# Patient Record
Sex: Female | Born: 1935 | Race: White | Hispanic: No | Smoking: Never smoker
Health system: Southern US, Community
[De-identification: ages and names within clinical notes are randomized; demographics above are authoritative.]

## PROBLEM LIST (undated history)

## (undated) DIAGNOSIS — F419 Anxiety disorder, unspecified: Secondary | ICD-10-CM

## (undated) DIAGNOSIS — F329 Major depressive disorder, single episode, unspecified: Secondary | ICD-10-CM

## (undated) DIAGNOSIS — Z8489 Family history of other specified conditions: Secondary | ICD-10-CM

## (undated) DIAGNOSIS — F32A Depression, unspecified: Secondary | ICD-10-CM

## (undated) DIAGNOSIS — E0590 Thyrotoxicosis, unspecified without thyrotoxic crisis or storm: Secondary | ICD-10-CM

## (undated) DIAGNOSIS — I10 Essential (primary) hypertension: Secondary | ICD-10-CM

## (undated) HISTORY — PX: APPENDECTOMY: SHX54

## (undated) HISTORY — PX: TONSILLECTOMY: SUR1361

## (undated) HISTORY — PX: HAMMER TOE SURGERY: SHX385

---

## 1981-12-19 HISTORY — PX: ABDOMINAL HYSTERECTOMY: SHX81

## 2008-12-19 HISTORY — PX: EYE SURGERY: SHX253

## 2014-12-17 ENCOUNTER — Other Ambulatory Visit (INDEPENDENT_AMBULATORY_CARE_PROVIDER_SITE_OTHER): Payer: Self-pay

## 2014-12-17 DIAGNOSIS — E213 Hyperparathyroidism, unspecified: Secondary | ICD-10-CM

## 2014-12-25 ENCOUNTER — Encounter (HOSPITAL_COMMUNITY): Payer: Self-pay

## 2014-12-30 ENCOUNTER — Encounter (HOSPITAL_COMMUNITY)
Admission: RE | Admit: 2014-12-30 | Discharge: 2014-12-30 | Disposition: A | Discharge status: Home / Self Care | Payer: Medicare Other | Source: Ambulatory Visit | Attending: Surgery | Admitting: Surgery

## 2014-12-30 DIAGNOSIS — E213 Hyperparathyroidism, unspecified: Secondary | ICD-10-CM

## 2014-12-30 MED ORDER — TECHNETIUM TC 99M SESTAMIBI GENERIC - CARDIOLITE: 25.0000 | Freq: Once | INTRAVENOUS | Status: AC | PRN

## 2015-01-05 ENCOUNTER — Other Ambulatory Visit (INDEPENDENT_AMBULATORY_CARE_PROVIDER_SITE_OTHER): Payer: Self-pay

## 2015-01-05 ENCOUNTER — Other Ambulatory Visit (INDEPENDENT_AMBULATORY_CARE_PROVIDER_SITE_OTHER): Payer: Self-pay | Admitting: *Deleted

## 2015-01-05 ENCOUNTER — Other Ambulatory Visit: Payer: Self-pay | Admitting: Surgery

## 2015-01-05 DIAGNOSIS — D351 Benign neoplasm of parathyroid gland: Secondary | ICD-10-CM

## 2015-01-05 NOTE — Addendum Note (Signed)
Addended by: Earnstine Regal on: 01/05/2015 03:02 PM   Modules accepted: Orders

## 2015-01-07 ENCOUNTER — Ambulatory Visit
Admission: RE | Admit: 2015-01-07 | Discharge: 2015-01-07 | Disposition: A | Discharge status: Home / Self Care | Payer: Medicare Other | Source: Ambulatory Visit | Attending: Surgery | Admitting: Surgery

## 2015-01-19 ENCOUNTER — Ambulatory Visit (INDEPENDENT_AMBULATORY_CARE_PROVIDER_SITE_OTHER): Payer: Self-pay | Admitting: Surgery

## 2015-02-12 NOTE — Pre-Procedure Instructions (Addendum)
Jaydah Stahle  02/12/2015   Your procedure is scheduled on:  Friday, March 4.  Report to [facility] Admitting at 7:30 AM.  Call this number if you have problems the morning of surgery: [phone]               For any other questions, please call [phone], Monday - Friday 8 AM - 4 PM.   Remember:   Do not eat food or drink liquids after midnight Thursday, march 3.   Take these medicines the morning of surgery with A SIP OF WATER: amLODipine (NORVASC), sertraline (ZOLOFT). Eye drops if needed               Stop taking Aspirin, Coumadin, Plavix, Effient and Herbal medications.  Do not take any NSAIDs ie: Ibuprofen,  Advil,Naproxen or any medication containing Aspirin. Stop vitamins starting now    Do not wear jewelry, make-up or nail polish.  Do not wear lotions, powders, or perfumes.   Do not shave 48 hours prior to surgery.   Do not bring valuables to the hospital. [facility]      is not responsible for any belongings or valuables.               Contacts, dentures or bridgework may not be worn into surgery.  Leave suitcase in the car. After surgery it may be brought to your room.  For patients admitted to the hospital, discharge time is determined by your treatment team.               Patients discharged the day of surgery will not be allowed to drive home.  Name and phone number of your driver:    Special Instructions: - Special Instructions: [facility] - Preparing for Surgery  Before surgery, you can play an important role.  Because skin is not sterile, your skin needs to be as free of germs as possible.  You can reduce the number of germs on you skin by washing with CHG (chlorahexidine gluconate) soap before surgery.  CHG is an antiseptic cleaner which kills germs and bonds with the skin to continue killing germs even after washing.  Please DO NOT use if you have an allergy to CHG or antibacterial soaps.  If your skin becomes reddened/irritated stop using the  CHG and inform your nurse when you arrive at Short Stay.  Do not shave (including legs and underarms) for at least 48 hours prior to the first CHG shower.  You may shave your face.  Please follow these instructions carefully:   1.  Shower with CHG Soap the night before surgery and the morning of Surgery.  2.  If you choose to wash your hair, wash your hair first as usual with your normal shampoo.  3.  After you shampoo, rinse your hair and body thoroughly to remove the Shampoo.  4.  Use CHG as you would any other liquid soap.  You can apply chg directly  to the skin and wash gently with scrungie or a clean washcloth.  5.  Apply the CHG Soap to your body ONLY FROM THE NECK DOWN.  Do not use on open wounds or open sores.  Avoid contact with your eyes ears, mouth and genitals (private parts).  Wash genitals (private parts)       with your normal soap.  6.  Wash thoroughly, paying special attention to the area where your surgery will be performed.  7.  Thoroughly rinse your body with  warm water from the neck down.  8.  DO NOT shower/wash with your normal soap after using and rinsing off the CHG Soap.  9.  Pat yourself dry with a clean towel.            10.  Wear clean pajamas.            11.  Place clean sheets on your bed the night of your first shower and do not sleep with pets.  Day of Surgery  Do not apply any lotions/deodorants the morning of surgery.  Please wear clean clothes to the hospital/surgery center.   Please read over the following fact sheets that you were given: Pain Booklet, Coughing and Deep Breathing and Surgical Site Infection Prevention

## 2015-02-13 ENCOUNTER — Encounter (HOSPITAL_COMMUNITY)
Admission: RE | Admit: 2015-02-13 | Discharge: 2015-02-13 | Disposition: A | Discharge status: Home / Self Care | Payer: Medicare Other | Source: Ambulatory Visit | Attending: Surgery | Admitting: Surgery

## 2015-02-13 ENCOUNTER — Encounter (HOSPITAL_COMMUNITY): Payer: Self-pay

## 2015-02-13 DIAGNOSIS — R0602 Shortness of breath: Secondary | ICD-10-CM | POA: Diagnosis not present

## 2015-02-13 DIAGNOSIS — I10 Essential (primary) hypertension: Secondary | ICD-10-CM | POA: Diagnosis not present

## 2015-02-13 DIAGNOSIS — Z01812 Encounter for preprocedural laboratory examination: Secondary | ICD-10-CM | POA: Insufficient documentation

## 2015-02-13 DIAGNOSIS — E210 Primary hyperparathyroidism: Secondary | ICD-10-CM | POA: Insufficient documentation

## 2015-02-13 DIAGNOSIS — F418 Other specified anxiety disorders: Secondary | ICD-10-CM | POA: Diagnosis not present

## 2015-02-13 HISTORY — DX: Essential (primary) hypertension: I10

## 2015-02-13 HISTORY — DX: Thyrotoxicosis, unspecified without thyrotoxic crisis or storm: E05.90

## 2015-02-13 HISTORY — DX: Anxiety disorder, unspecified: F41.9

## 2015-02-13 HISTORY — DX: Family history of other specified conditions: Z84.89

## 2015-02-13 HISTORY — DX: Major depressive disorder, single episode, unspecified: F32.9

## 2015-02-13 HISTORY — DX: Depression, unspecified: F32.A

## 2015-02-13 LAB — CBC
HCT: 40.9 % (ref 36.0–46.0)
Hemoglobin: 13.5 g/dL (ref 12.0–15.0)
MCH: 30.1 pg (ref 26.0–34.0)
MCHC: 33 g/dL (ref 30.0–36.0)
MCV: 91.1 fL (ref 78.0–100.0)
PLATELETS: 239 10*3/uL (ref 150–400)
RBC: 4.49 MIL/uL (ref 3.87–5.11)
RDW: 14 % (ref 11.5–15.5)
WBC: 5.4 10*3/uL (ref 4.0–10.5)

## 2015-02-13 LAB — BASIC METABOLIC PANEL
ANION GAP: 4 — AB (ref 5–15)
BUN: 9 mg/dL (ref 6–23)
CALCIUM: 9.5 mg/dL (ref 8.4–10.5)
CO2: 27 mmol/L (ref 19–32)
CREATININE: 0.76 mg/dL (ref 0.50–1.10)
Chloride: 106 mmol/L (ref 96–112)
GFR calc Af Amer: 89 mL/min — ABNORMAL LOW (ref >90)
GFR calc non Af Amer: 76 mL/min — ABNORMAL LOW (ref >90)
GLUCOSE: 111 mg/dL — AB (ref 70–99)
Potassium: 4.3 mmol/L (ref 3.5–5.1)
SODIUM: 137 mmol/L (ref 135–145)

## 2015-02-13 NOTE — Progress Notes (Signed)
Quick Note:  These results are acceptable for scheduled surgery.  Robbin Escher M. Lathon Adan, MD, FACS [facility] Office: [phone]   ______ 

## 2015-02-13 NOTE — Progress Notes (Signed)
Quick Note:  EKG is acceptable for scheduled surgery.  Domini Vandehei M. Khalib Fendley, MD, FACS [facility] Office: [phone]   ______ 

## 2015-02-19 ENCOUNTER — Encounter (HOSPITAL_COMMUNITY): Payer: Self-pay | Admitting: Surgery

## 2015-02-19 DIAGNOSIS — E210 Primary hyperparathyroidism: Secondary | ICD-10-CM | POA: Diagnosis present

## 2015-02-19 MED ORDER — CEFAZOLIN SODIUM-DEXTROSE 2-3 GM-% IV SOLR
2.0000 g | INTRAVENOUS | Status: AC
  Administered 2015-02-20: 2 g via INTRAVENOUS
  Filled 2015-02-19: qty 50

## 2015-02-19 NOTE — H&P (Signed)
General Surgery [facility]  Sue Hines DOB: 1930-10-28 Widowed / Language: English / Race: White Female  History of Present Illness  The patient is a 81 year old female who presents with a parathyroid neoplasm. Patient is referred by Dr. Bud Face for evaluation of suspected primary hyperparathyroidism. Patient was found on routine laboratory studies performed by her primary care physician to have mild hypercalcemia. Laboratory studies show calcium levels of 10.8. Patient had intact PTH levels ranging from 64-107. She subsequently underwent 24-hour urine collection with a range of results from 151 to greater than 1900. Patient has been treated for vitamin D deficiency. She is now referred for possible primary hyperparathyroidism. She has not had any imaging studies. Patient denies any previous neck surgery. She denies any family history of endocrinopathy and specifically no history of adrenal or pituitary tumors. Patient denies nephrolithiasis. She thinks she has osteopenia but not osteoporosis. She denies bone or joint pain. She denies fatigue. She is accompanied today by her daughter.   Other Problems High blood pressure  Past Surgical History Appendectomy Hysterectomy (not due to cancer) - Complete Tonsillectomy  Diagnostic Studies History Colonoscopy 1-5 years ago Mammogram within last year  Allergies  Sulfa Antibiotics  Medication History Betaxolol HCl (Ophth) (0.5% Solution, Ophthalmic bid) Active. Aspirin Low Strength (81MG  Tablet Chewable, Oral) Active. AmLODIPine Besylate (5MG  Tablet, Oral) Active. Montelukast Sodium (10MG  Tablet, Oral) Active. Estradiol (0.05MG /24HR Patch Weekly, Transdermal) Active.  Social History Alcohol use Moderate alcohol use. Caffeine use Coffee, Tea. No drug use Tobacco use Never smoker.  Family History Alcohol Abuse Mother. Heart Disease Brother, Father. Thyroid problems  Daughter, Son.  Pregnancy / Birth History  Age at menarche 65 years. Age of menopause 42-50 Gravida 2 Irregular periods Maternal age 20-25 Para 2  Review of Systems  General Not Present- Appetite Loss, Chills, Fatigue, Fever, Night Sweats, Weight Gain and Weight Loss. Skin Not Present- Change in Wart/Mole, Dryness, Hives, Jaundice, New Lesions, Non-Healing Wounds, Rash and Ulcer. HEENT Present- Seasonal Allergies and Wears glasses/contact lenses. Not Present- Earache, Hearing Loss, Hoarseness, Nose Bleed, Oral Ulcers, Ringing in the Ears, Sinus Pain, Sore Throat, Visual Disturbances and Yellow Eyes. Respiratory Not Present- Bloody sputum, Chronic Cough, Difficulty Breathing, Snoring and Wheezing. Breast Not Present- Breast Mass, Breast Pain, Nipple Discharge and Skin Changes. Cardiovascular Not Present- Chest Pain, Difficulty Breathing Lying Down, Leg Cramps, Palpitations, Rapid Heart Rate, Shortness of Breath and Swelling of Extremities. Gastrointestinal Not Present- Abdominal Pain, Bloating, Bloody Stool, Change in Bowel Habits, Chronic diarrhea, Constipation, Difficulty Swallowing, Excessive gas, Gets full quickly at meals, Hemorrhoids, Indigestion, Nausea, Rectal Pain and Vomiting. Female Genitourinary Present- Frequency and Nocturia. Not Present- Painful Urination, Pelvic Pain and Urgency. Musculoskeletal Not Present- Back Pain, Joint Pain, Joint Stiffness, Muscle Pain, Muscle Weakness and Swelling of Extremities. Neurological Not Present- Decreased Memory, Fainting, Headaches, Numbness, Seizures, Tingling, Tremor, Trouble walking and Weakness. Psychiatric Not Present- Anxiety, Bipolar, Change in Sleep Pattern, Depression, Fearful and Frequent crying. Endocrine Not Present- Cold Intolerance, Excessive Hunger, Hair Changes, Heat Intolerance, Hot flashes and New Diabetes. Hematology Not Present- Easy Bruising, Excessive bleeding, Gland problems, HIV and Persistent  Infections.   Vitals  12/17/2014 2:09 PM Weight: 190 lb Height: 66in Body Surface Area: 2 m Body Mass Index: 30.67 kg/m Temp.: 98.41F(Temporal)  Pulse: 72 (Regular)  Resp.: 18 (Unlabored)  BP: 116/70 (Sitting, Left Arm, Standard)    Physical Exam  General - appears comfortable, no distress; not diaphorectic  HEENT - normocephalic; sclerae clear,  gaze conjugate; mucous membranes moist, dentition good; voice normal  Neck - symmetric on extension; no palpable anterior or posterior cervical adenopathy; no palpable masses in the thyroid bed  Chest - clear bilaterally with rhonchi, rales, or wheeze  Cor - regular rhythm with normal rate; no significant murmur  Ext - non-tender without significant edema or lymphedema  Neuro - grossly intact; no tremor    Assessment & Plan  PRIMARY HYPERPARATHYROIDISM (252.01  E21.0)  The patient most likely has primary hyperparathyroidism. I provided her with written literature on parathyroid disease to review at home.  Nuclear med scan was negative for adenoma.  Ultrasound shows a 6 mm nodule in the right inferior position suspicious for adenoma.  Plan exploration and parathyroidectomy.  The risks and benefits of the procedure have been discussed at length with the patient.  The patient understands the proposed procedure, potential alternative treatments, and the course of recovery to be expected.  All of the patient's questions have been answered at this time.  The patient wishes to proceed with surgery.  Earnstine Regal, MD, [facility] Office: [phone]

## 2015-02-20 ENCOUNTER — Ambulatory Visit (HOSPITAL_COMMUNITY): Payer: Medicare Other | Admitting: Certified Registered"

## 2015-02-20 ENCOUNTER — Observation Stay (HOSPITAL_COMMUNITY)
Admission: RE | Admit: 2015-02-20 | Discharge: 2015-02-21 | Disposition: A | Discharge status: Home / Self Care | Payer: Medicare Other | Source: Ambulatory Visit | Attending: Surgery | Admitting: Surgery

## 2015-02-20 ENCOUNTER — Encounter (HOSPITAL_COMMUNITY): Payer: Self-pay | Admitting: *Deleted

## 2015-02-20 ENCOUNTER — Encounter (HOSPITAL_COMMUNITY)
Admission: RE | Disposition: A | Discharge status: Home / Self Care | Payer: Self-pay | Source: Ambulatory Visit | Attending: Surgery

## 2015-02-20 DIAGNOSIS — E210 Primary hyperparathyroidism: Secondary | ICD-10-CM | POA: Diagnosis not present

## 2015-02-20 DIAGNOSIS — F418 Other specified anxiety disorders: Secondary | ICD-10-CM | POA: Insufficient documentation

## 2015-02-20 DIAGNOSIS — R0602 Shortness of breath: Secondary | ICD-10-CM | POA: Insufficient documentation

## 2015-02-20 DIAGNOSIS — I10 Essential (primary) hypertension: Secondary | ICD-10-CM | POA: Diagnosis not present

## 2015-02-20 HISTORY — PX: PARATHYROIDECTOMY: SHX19

## 2015-02-20 SURGERY — PARATHYROIDECTOMY
Anesthesia: General

## 2015-02-20 MED ORDER — ARTIFICIAL TEARS OP OINT
TOPICAL_OINTMENT | OPHTHALMIC | Status: DC | PRN
  Administered 2015-02-20: 1 via OPHTHALMIC

## 2015-02-20 MED ORDER — LACTATED RINGERS IV SOLN
INTRAVENOUS | Status: DC
  Administered 2015-02-20: 08:00:00 via INTRAVENOUS

## 2015-02-20 MED ORDER — LACTATED RINGERS IV SOLN
INTRAVENOUS | Status: DC | PRN
  Administered 2015-02-20 (×2): via INTRAVENOUS

## 2015-02-20 MED ORDER — NEOSTIGMINE METHYLSULFATE 10 MG/10ML IV SOLN
INTRAVENOUS | Status: AC
  Filled 2015-02-20: qty 1

## 2015-02-20 MED ORDER — NEOSTIGMINE METHYLSULFATE 10 MG/10ML IV SOLN
INTRAVENOUS | Status: DC | PRN
  Administered 2015-02-20: 4 mg via INTRAVENOUS

## 2015-02-20 MED ORDER — KCL IN DEXTROSE-NACL 20-5-0.45 MEQ/L-%-% IV SOLN
INTRAVENOUS | Status: DC
  Administered 2015-02-20: 17:00:00 via INTRAVENOUS
  Filled 2015-02-20 (×2): qty 1000

## 2015-02-20 MED ORDER — FENTANYL CITRATE 0.05 MG/ML IJ SOLN
INTRAMUSCULAR | Status: AC
  Filled 2015-02-20: qty 5

## 2015-02-20 MED ORDER — FENTANYL CITRATE 0.05 MG/ML IJ SOLN
INTRAMUSCULAR | Status: DC | PRN
  Administered 2015-02-20: 50 ug via INTRAVENOUS
  Administered 2015-02-20: 150 ug via INTRAVENOUS

## 2015-02-20 MED ORDER — ONDANSETRON HCL 4 MG/2ML IJ SOLN
4.0000 mg | Freq: Four times a day (QID) | INTRAMUSCULAR | Status: DC | PRN
Start: 2015-02-20 — End: 2015-02-21

## 2015-02-20 MED ORDER — ACETAMINOPHEN 325 MG PO TABS: 650.0000 mg | ORAL_TABLET | ORAL | Status: DC | PRN

## 2015-02-20 MED ORDER — HYDROCODONE-ACETAMINOPHEN 5-325 MG PO TABS: 1.0000 | ORAL_TABLET | ORAL | Status: DC | PRN

## 2015-02-20 MED ORDER — EPHEDRINE SULFATE 50 MG/ML IJ SOLN
INTRAMUSCULAR | Status: DC | PRN
  Administered 2015-02-20: 5 mg via INTRAVENOUS
  Administered 2015-02-20: 10 mg via INTRAVENOUS

## 2015-02-20 MED ORDER — GLYCOPYRROLATE 0.2 MG/ML IJ SOLN
INTRAMUSCULAR | Status: DC | PRN
  Administered 2015-02-20: 0.6 mg via INTRAVENOUS

## 2015-02-20 MED ORDER — PHENYLEPHRINE 40 MCG/ML (10ML) SYRINGE FOR IV PUSH (FOR BLOOD PRESSURE SUPPORT)
PREFILLED_SYRINGE | INTRAVENOUS | Status: AC
  Filled 2015-02-20: qty 20

## 2015-02-20 MED ORDER — PROPOFOL 10 MG/ML IV BOLUS
INTRAVENOUS | Status: DC | PRN
  Administered 2015-02-20: 130 mg via INTRAVENOUS

## 2015-02-20 MED ORDER — HYDROMORPHONE HCL 1 MG/ML IJ SOLN: 1.0000 mg | INTRAMUSCULAR | Status: DC | PRN

## 2015-02-20 MED ORDER — PHENYLEPHRINE HCL 10 MG/ML IJ SOLN
INTRAMUSCULAR | Status: DC | PRN
  Administered 2015-02-20: 120 ug via INTRAVENOUS
  Administered 2015-02-20 (×4): 80 ug via INTRAVENOUS

## 2015-02-20 MED ORDER — PROPOFOL 10 MG/ML IV BOLUS
INTRAVENOUS | Status: AC
  Filled 2015-02-20: qty 20

## 2015-02-20 MED ORDER — ARTIFICIAL TEARS OP OINT
TOPICAL_OINTMENT | OPHTHALMIC | Status: AC
  Filled 2015-02-20: qty 3.5

## 2015-02-20 MED ORDER — PHENOL 1.4 % MT LIQD
1.0000 | OROMUCOSAL | Status: DC | PRN
  Administered 2015-02-20: 1 via OROMUCOSAL
  Filled 2015-02-20: qty 177

## 2015-02-20 MED ORDER — 0.9 % SODIUM CHLORIDE (POUR BTL) OPTIME
TOPICAL | Status: DC | PRN
  Administered 2015-02-20: 1000 mL

## 2015-02-20 MED ORDER — ONDANSETRON HCL 4 MG/2ML IJ SOLN: 4.0000 mg | Freq: Once | INTRAMUSCULAR | Status: DC | PRN

## 2015-02-20 MED ORDER — ONDANSETRON HCL 4 MG/2ML IJ SOLN
INTRAMUSCULAR | Status: AC
  Filled 2015-02-20: qty 2

## 2015-02-20 MED ORDER — AMLODIPINE BESYLATE 5 MG PO TABS
5.0000 mg | ORAL_TABLET | Freq: Every day | ORAL | Status: DC
  Administered 2015-02-21: 5 mg via ORAL
  Filled 2015-02-20: qty 1

## 2015-02-20 MED ORDER — HYDROMORPHONE HCL 1 MG/ML IJ SOLN
0.2500 mg | INTRAMUSCULAR | Status: DC | PRN
  Administered 2015-02-20 (×4): 0.5 mg via INTRAVENOUS

## 2015-02-20 MED ORDER — HYDROMORPHONE HCL 1 MG/ML IJ SOLN
INTRAMUSCULAR | Status: AC
  Filled 2015-02-20: qty 1

## 2015-02-20 MED ORDER — BETAXOLOL HCL 0.5 % OP SOLN
1.0000 [drp] | Freq: Two times a day (BID) | OPHTHALMIC | Status: DC
  Administered 2015-02-21: 1 [drp] via OPHTHALMIC
  Filled 2015-02-20: qty 5

## 2015-02-20 MED ORDER — LATANOPROST 0.005 % OP SOLN
1.0000 [drp] | Freq: Every day | OPHTHALMIC | Status: DC
  Filled 2015-02-20: qty 2.5

## 2015-02-20 MED ORDER — DEXAMETHASONE SODIUM PHOSPHATE 4 MG/ML IJ SOLN
INTRAMUSCULAR | Status: AC
Start: 2015-02-20 — End: 2015-02-20
  Filled 2015-02-20: qty 1

## 2015-02-20 MED ORDER — ROCURONIUM BROMIDE 100 MG/10ML IV SOLN
INTRAVENOUS | Status: DC | PRN
  Administered 2015-02-20: 40 mg via INTRAVENOUS

## 2015-02-20 MED ORDER — MEPERIDINE HCL 25 MG/ML IJ SOLN: 6.2500 mg | INTRAMUSCULAR | Status: DC | PRN

## 2015-02-20 MED ORDER — HYDROCODONE-ACETAMINOPHEN 5-325 MG PO TABS: 1.0000 | ORAL_TABLET | ORAL | Status: AC | PRN

## 2015-02-20 MED ORDER — LIDOCAINE HCL (CARDIAC) 20 MG/ML IV SOLN
INTRAVENOUS | Status: DC | PRN
  Administered 2015-02-20: 100 mg via INTRAVENOUS

## 2015-02-20 MED ORDER — ONDANSETRON HCL 4 MG PO TABS: 4.0000 mg | ORAL_TABLET | Freq: Four times a day (QID) | ORAL | Status: DC | PRN

## 2015-02-20 MED ORDER — SERTRALINE HCL 50 MG PO TABS
50.0000 mg | ORAL_TABLET | Freq: Every day | ORAL | Status: DC
  Administered 2015-02-21: 50 mg via ORAL
  Filled 2015-02-20: qty 1

## 2015-02-20 MED ORDER — BUPIVACAINE HCL (PF) 0.25 % IJ SOLN
INTRAMUSCULAR | Status: AC
Start: 2015-02-20 — End: 2015-02-20
  Filled 2015-02-20: qty 30

## 2015-02-20 MED ORDER — ONDANSETRON HCL 4 MG/2ML IJ SOLN
INTRAMUSCULAR | Status: DC | PRN
  Administered 2015-02-20: 4 mg via INTRAVENOUS

## 2015-02-20 MED ORDER — GLYCOPYRROLATE 0.2 MG/ML IJ SOLN
INTRAMUSCULAR | Status: AC
  Filled 2015-02-20: qty 3

## 2015-02-20 SURGICAL SUPPLY — 47 items
ATTRACTOMAT 16X20 MAGNETIC DRP (DRAPES) ×2 IMPLANT
BENZOIN TINCTURE PRP APPL 2/3 (GAUZE/BANDAGES/DRESSINGS) ×2 IMPLANT
BLADE SURG 10 STRL SS (BLADE) ×2 IMPLANT
BLADE SURG 15 STRL LF DISP TIS (BLADE) ×1 IMPLANT
BLADE SURG 15 STRL SS (BLADE) ×1
CANISTER SUCTION 2500CC (MISCELLANEOUS) ×2 IMPLANT
CHLORAPREP W/TINT 10.5 ML (MISCELLANEOUS) ×2 IMPLANT
CLIP TI MEDIUM 6 (CLIP) ×2 IMPLANT
CLIP TI WIDE RED SMALL 6 (CLIP) ×6 IMPLANT
CONT SPEC 4OZ CLIKSEAL STRL BL (MISCELLANEOUS) ×2 IMPLANT
COVER SURGICAL LIGHT HANDLE (MISCELLANEOUS) ×2 IMPLANT
DERMABOND ADVANCED (GAUZE/BANDAGES/DRESSINGS) ×1
DERMABOND ADVANCED .7 DNX12 (GAUZE/BANDAGES/DRESSINGS) ×1 IMPLANT
DRAPE PED LAPAROTOMY (DRAPES) ×2 IMPLANT
DRAPE UTILITY XL STRL (DRAPES) ×6 IMPLANT
ELECT CAUTERY BLADE 6.4 (BLADE) ×2 IMPLANT
ELECT REM PT RETURN 9FT ADLT (ELECTROSURGICAL) ×2
ELECTRODE REM PT RTRN 9FT ADLT (ELECTROSURGICAL) ×1 IMPLANT
GAUZE SPONGE 2X2 8PLY STRL LF (GAUZE/BANDAGES/DRESSINGS) ×1 IMPLANT
GAUZE SPONGE 4X4 16PLY XRAY LF (GAUZE/BANDAGES/DRESSINGS) ×2 IMPLANT
GLOVE SURG ORTHO 8.0 STRL STRW (GLOVE) ×4 IMPLANT
GOWN STRL REUS W/ TWL LRG LVL3 (GOWN DISPOSABLE) ×4 IMPLANT
GOWN STRL REUS W/ TWL XL LVL3 (GOWN DISPOSABLE) ×1 IMPLANT
GOWN STRL REUS W/TWL LRG LVL3 (GOWN DISPOSABLE) ×4
GOWN STRL REUS W/TWL XL LVL3 (GOWN DISPOSABLE) ×1
HEMOSTAT SURGICEL 2X4 FIBR (HEMOSTASIS) ×2 IMPLANT
KIT BASIN OR (CUSTOM PROCEDURE TRAY) ×2 IMPLANT
KIT ROOM TURNOVER OR (KITS) ×2 IMPLANT
NEEDLE HYPO 25GX1X1/2 BEV (NEEDLE) IMPLANT
NS IRRIG 1000ML POUR BTL (IV SOLUTION) ×2 IMPLANT
PACK SURGICAL SETUP 50X90 (CUSTOM PROCEDURE TRAY) ×2 IMPLANT
PAD ARMBOARD 7.5X6 YLW CONV (MISCELLANEOUS) ×4 IMPLANT
PENCIL BUTTON HOLSTER BLD 10FT (ELECTRODE) ×2 IMPLANT
SPONGE GAUZE 2X2 STER 10/PKG (GAUZE/BANDAGES/DRESSINGS) ×1
SPONGE INTESTINAL PEANUT (DISPOSABLE) IMPLANT
STRIP CLOSURE SKIN 1/2X4 (GAUZE/BANDAGES/DRESSINGS) IMPLANT
SUT MNCRL AB 4-0 PS2 18 (SUTURE) ×2 IMPLANT
SUT SILK 2 0 (SUTURE) ×1
SUT SILK 2-0 18XBRD TIE 12 (SUTURE) ×1 IMPLANT
SUT SILK 3 0 (SUTURE)
SUT SILK 3-0 18XBRD TIE 12 (SUTURE) IMPLANT
SUT VIC AB 3-0 SH 18 (SUTURE) ×2 IMPLANT
SYR BULB IRRIGATION 50ML (SYRINGE) ×2 IMPLANT
SYR CONTROL 10ML LL (SYRINGE) IMPLANT
TOWEL OR 17X24 6PK STRL BLUE (TOWEL DISPOSABLE) ×2 IMPLANT
TOWEL OR 17X26 10 PK STRL BLUE (TOWEL DISPOSABLE) ×2 IMPLANT
TUBE CONNECTING 12X1/4 (SUCTIONS) ×2 IMPLANT

## 2015-02-20 NOTE — Anesthesia Preprocedure Evaluation (Addendum)
Anesthesia Evaluation  Patient identified by MRN, date of birth, ID band Patient awake    Reviewed: Allergy & Precautions, NPO status , Patient's Chart, lab work & pertinent test results  Airway Mallampati: I  TM Distance: >3 FB Neck ROM: Full    Dental  (+) Teeth Intact, Dental Advisory Given   Pulmonary          Cardiovascular hypertension, Pt. on medications     Neuro/Psych Anxiety Depression    GI/Hepatic   Endo/Other    Renal/GU      Musculoskeletal   Abdominal   Peds  Hematology   Anesthesia Other Findings   Reproductive/Obstetrics                            Anesthesia Physical Anesthesia Plan  ASA: III  Anesthesia Plan: General   Post-op Pain Management:    Induction: Intravenous  Airway Management Planned: Oral ETT  Additional Equipment:   Intra-op Plan:   Post-operative Plan: Extubation in OR  Informed Consent: I have reviewed the patients History and Physical, chart, labs and discussed the procedure including the risks, benefits and alternatives for the proposed anesthesia with the patient or authorized representative who has indicated his/her understanding and acceptance.     Plan Discussed with: CRNA and Surgeon  Anesthesia Plan Comments:         Anesthesia Quick Evaluation

## 2015-02-20 NOTE — Interval H&P Note (Signed)
History and Physical Interval Note:  02/20/2015 9:50 AM  Sue Hines  has presented today for surgery, with the diagnosis of PRIMARY HYPERPARATHYROIDISM.  The various methods of treatment have been discussed with the patient and family. After consideration of risks, benefits and other options for treatment, the patient has consented to   Procedure(s): PARATHYROIDECTOMY (N/A) as a surgical intervention .    The patient's history has been reviewed, patient examined, no change in status, stable for surgery.  I have reviewed the patient's chart and labs.  Questions were answered to the patient's satisfaction.    Earnstine Regal, MD, [facility] Office: [place]

## 2015-02-20 NOTE — Discharge Instructions (Signed)
[facility]  THYROID & PARATHYROID SURGERY:  POST-OP INSTRUCTIONS  Always review your discharge instruction sheet from the facility where your surgery was performed.  A prescription for pain medication may be given to you upon discharge.  Take your pain medication as prescribed.  If narcotic pain medicine is not needed, then you may take acetaminophen (Tylenol) or ibuprofen (Advil) as needed.  Take your usually prescribed medications unless otherwise directed.  If you need a refill on your pain medication, please contact your pharmacy. They will contact our office to request authorization.  Prescriptions will not be processed after 5 pm or on weekends.  Start with a light diet upon arrival home, such as soup and crackers or toast.  Be sure to drink plenty of fluids daily.  Resume your normal diet the day after surgery.  Most patients will experience some swelling and bruising on the chest and neck area.  Ice packs will help.  Swelling and bruising can take several days to resolve.   It is common to experience some constipation after surgery.  Increasing fluid intake and taking a stool softener will usually help or prevent this problem.  A mild laxative (Milk of Magnesia or Miralax) should be taken according to package directions if there has been no bowel movement after 48 hours.  If you have steri-strips and a gauze dressing over your incision, you may remove the gauze bandage on the second day after surgery, and you may shower at that time.  Leave your steri-strips (small skin tapes) in place directly over the incision.  These strips should remain on the skin for 7-10 days and then be removed.  You may get them wet in the shower and pat them dry.  If you have Dermabond (topical glue) over your incision, you may shower at any time following your procedure.  Leave the glue in place for 10-14 days.  When it begins to flake off around the edges, you may remove it.  You may  resume regular (light) daily activities beginning the next day--such as daily self-care, walking, climbing stairs--gradually increasing activities as tolerated.  You may have sexual intercourse when it is comfortable.  Refrain from any heavy lifting or straining until approved by your doctor.  You may drive when you no longer are taking prescription pain medication, you can comfortably wear a seatbelt, and you can safely maneuver your car and apply brakes.  You should see your doctor in the office for a follow-up appointment approximately two to three weeks after your surgery.  Make sure that you call for this appointment within a day or two after you arrive home to insure a convenient appointment time.  WHEN TO CALL YOUR DOCTOR: -- Fever greater than 101.5 -- Inability to urinate -- Nausea and/or vomiting - persistent -- Extreme swelling or bruising -- Continued bleeding from incision -- Increased pain, redness, or drainage from the incision -- Difficulty swallowing or breathing -- Muscle cramping or spasms -- Numbness or tingling in hands or around lips  The clinic staff is available to answer your questions during regular business hours.  Please dont hesitate to call and ask to speak to one of the nurses if you have concerns.  Earnstine Regal, MD, [facility] Office: [phone]  Website: [email or web address]

## 2015-02-20 NOTE — Op Note (Signed)
Sue Hines, Sue Hines             ACCOUNT NO.:  [id number]  MEDICAL RECORD NO.:  [id number]  LOCATION:  6N27C                        FACILITY:  [facility]  PHYSICIAN:  Earnstine Regal, MD      DATE OF BIRTH:  [birth year]  DATE OF PROCEDURE:  02/20/2015                              OPERATIVE REPORT   PREOPERATIVE DIAGNOSIS:  Primary hyperparathyroidism.  POSTOPERATIVE DIAGNOSIS:  Primary hyperparathyroidism.  PROCEDURE: 1. Neck exploration. 2. Left superior parathyroidectomy. 3. Biopsy of right and left inferior parathyroid glands.  SURGEON:  Earnstine Regal, MD, FACS  ASSISTANT:  Odis Hollingshead, MD, FACS  ANESTHESIA:  General per Dr. Lillia Abed.  ESTIMATED BLOOD LOSS:  Minimal.  PREPARATION:  ChloraPrep.  COMPLICATIONS:  None.  INDICATIONS:  The patient is an 81 year old female referred by Dr. Dorris Fetch from Endocrinology with suspected primary hyperparathyroidism.  The patient had hypercalcemia with calcium levels of 10.8.  Intact PTH levels ranged from 64-107.  A 24-hour urine collection was elevated for calcium.  The patient underwent nuclear medicine parathyroid scan, which failed to localize a parathyroid adenoma.  Ultrasound demonstrated a 6 mm nodule in the right inferior position, suspicious for adenoma.  The patient now comes to operation for neck exploration and parathyroidectomy.  BODY OF REPORT:  Procedure was done in OR #2 at the [facility].  The patient was brought to the operating room, placed in supine position on the operating room table.  Following administration of general anesthesia, the patient was positioned and then prepped and draped in the usual aseptic fashion.  After ascertaining that an adequate level of anesthesia had been achieved, a Kocher incision was made with #15 blade.  Dissection was carried through subcutaneous tissues and platysma.  Hemostasis was achieved with the electrocautery.  Skin flaps were elevated cephalad and  caudad from the thyroid notch to the sternal notch.  A Mahorner self-retaining retractor was placed for exposure.  Strap muscles were incised in the midline. Dissection was begun on the right side.  A right inferior parathyroid gland was identified.  This appears grossly normal.  It was somewhat fatty replaced.  Further exploration on the right fails to reveal a superior parathyroid gland.  Next, the left side was explored.  Again, there was a normal parathyroid gland in the left inferior position which appears fatty replaced.  The superior pole of the thyroid was explored.  Posterior to the superior pole, was an enlarged parathyroid gland which was carefully dissected out.  The vascular pedicle was divided between small Ligaclips.  The gland was excised in its entirety.  It appears to be approximately 3 times the size of the inferior glands.  It was submitted to Pathology for frozen section.  Dr. Donato Heinz confirmed parathyroid tissue which was hypercellular but cannot make a definite diagnosis of adenoma.  Therefore, further exploration in the right superior position was performed.  The precervical space was opened widely.  The retroesophageal space was explored.  The carotid sheath on the right was explored.  There was no evidence of enlarged parathyroid tissue or adenoma.  Both of the inferior parathyroid glands were then biopsied by mobilizing them and placing a medium Ligaclip across  each gland and excising a small portion of the gland which was submitted for frozen section.  Both of these biopsies confirmed parathyroid tissue which was essentially normal.  Decision was made to discontinue further exploration.  It was likely that the left superior gland represents an early adenoma.  Neck was irrigated with warm saline.  Good hemostasis was noted.  Fibrillar was placed throughout the operative field.  Strap muscles were reapproximated in the midline with interrupted 3-0 Vicryl  sutures. Platysma was closed with interrupted 3-0 Vicryl sutures.  Skin was closed with a running 4-0 Monocryl subcuticular suture.  Wound was washed and dried and Dermabond was applied to the skin.  The patient was awakened from anesthesia and brought to the recovery room.  The patient tolerated the procedure well.   Earnstine Regal, MD, [facility] Office: [phone]   TMG/MEDQ  D:  02/20/2015  T:  02/20/2015  Job:  [id number]  cc:   Loni Beckwith, MD

## 2015-02-20 NOTE — Transfer of Care (Signed)
Immediate Anesthesia Transfer of Care Note  Patient: Sue Hines  Procedure(s) Performed: Procedure(s): PARATHYROIDECTOMY (N/A)  Patient Location: PACU  Anesthesia Type:General  Level of Consciousness: awake, alert  and oriented  Airway & Oxygen Therapy: Patient Spontanous Breathing and Patient connected to nasal cannula oxygen  Post-op Assessment: Report given to RN, Post -op Vital signs reviewed and stable and Patient moving all extremities X 4  Post vital signs: Reviewed and stable  Last Vitals:  Filed Vitals:   02/20/15 0723  BP: 146/69  Pulse: 68  Temp: 36.4 C  Resp: 20    Complications: No apparent anesthesia complications

## 2015-02-20 NOTE — Anesthesia Postprocedure Evaluation (Signed)
Anesthesia Post Note  Patient: Sue Hines  Procedure(s) Performed: Procedure(s) (LRB): PARATHYROIDECTOMY (N/A)  Anesthesia type: general  Patient location: PACU  Post pain: Pain level controlled  Post assessment: Patient's Cardiovascular Status Stable  Last Vitals:  Filed Vitals:   02/20/15 1327  BP: 133/71  Pulse: 85  Temp: 36.5 C  Resp: 12    Post vital signs: Reviewed and stable  Level of consciousness: sedated  Complications: No apparent anesthesia complications

## 2015-02-20 NOTE — Anesthesia Procedure Notes (Signed)
Procedure Name: Intubation Date/Time: 02/20/2015 10:01 AM Performed by: Gaylene Brooks Pre-anesthesia Checklist: Emergency Drugs available, Patient being monitored, Suction available, Patient identified and Timeout performed Patient Re-evaluated:Patient Re-evaluated prior to inductionOxygen Delivery Method: Circle system utilized Preoxygenation: Pre-oxygenation with 100% oxygen Intubation Type: IV induction Ventilation: Mask ventilation without difficulty Laryngoscope Size: Miller and 2 Grade View: Grade I Tube type: Oral Tube size: 7.0 mm Number of attempts: 1 Airway Equipment and Method: Stylet Placement Confirmation: ETT inserted through vocal cords under direct vision,  breath sounds checked- equal and bilateral,  positive ETCO2 and CO2 detector Secured at: 21 cm Tube secured with: Tape Dental Injury: Teeth and Oropharynx as per pre-operative assessment

## 2015-02-20 NOTE — Brief Op Note (Signed)
02/20/2015  11:26 AM  PATIENT:  Sue Hines  81 y.o. female  PRE-OPERATIVE DIAGNOSIS:  PRIMARY HYPERPARATHYROIDISM  POST-OPERATIVE DIAGNOSIS:  same  PROCEDURE:  Procedure(s): PARATHYROIDECTOMY (N/A)  SURGEON:  Surgeon(s) and Role:    * Armandina Gemma, MD - Primary    * Jackolyn Confer, MD - Assisting  ANESTHESIA:   general  EBL:  Total I/O In: 1000 [I.V.:1000] Out: -   BLOOD ADMINISTERED:none  DRAINS: none   LOCAL MEDICATIONS USED:  NONE  SPECIMEN:  Excision  DISPOSITION OF SPECIMEN:  PATHOLOGY  COUNTS:  YES  TOURNIQUET:  * No tourniquets in log *  DICTATION: .Other Dictation: Dictation Number [id number]  PLAN OF CARE: Admit for overnight observation  PATIENT DISPOSITION:  PACU - hemodynamically stable.   Delay start of Pharmacological VTE agent (>24hrs) due to surgical blood loss or risk of bleeding: yes  Earnstine Regal, MD, [facility] Office: [phone]

## 2015-02-21 DIAGNOSIS — E210 Primary hyperparathyroidism: Secondary | ICD-10-CM | POA: Diagnosis not present

## 2015-02-21 LAB — BASIC METABOLIC PANEL
Anion gap: 6 (ref 5–15)
BUN: 6 mg/dL (ref 6–23)
CHLORIDE: 104 mmol/L (ref 96–112)
CO2: 27 mmol/L (ref 19–32)
CREATININE: 0.8 mg/dL (ref 0.50–1.10)
Calcium: 9 mg/dL (ref 8.4–10.5)
GFR calc Af Amer: 77 mL/min — ABNORMAL LOW (ref >90)
GFR calc non Af Amer: 67 mL/min — ABNORMAL LOW (ref >90)
Glucose, Bld: 106 mg/dL — ABNORMAL HIGH (ref 70–99)
Potassium: 3.6 mmol/L (ref 3.5–5.1)
SODIUM: 137 mmol/L (ref 135–145)

## 2015-02-21 NOTE — Progress Notes (Addendum)
Discharge instructions provided to patient.  Patient instructed on S&S of infection and when to contact physician.  Patient discharged with prescription for pain meds and instructions given on proper use.  Reviewed home medications.  Patient was instructed not to put anything on incisional site and wash gently with regular soap and water only.  Hand washing and infection prevention discussed.  Patient instructed to follow regular diet and resume light daily activity.  Patient instructed to follow up with Dr. Harlow Asa and schedule appointment for post-op visit and wound evaluation.

## 2015-02-21 NOTE — Progress Notes (Signed)
UR completed 

## 2015-02-21 NOTE — Progress Notes (Addendum)
Discharge instructions gone over by [place], [place]. Prescription given. Patient's daughter called at 33a.m.Marland Kitchen  Stated they left instructions and prescription and asked that they be mailed. Charge nurse was informed. Informed patient and daughter that mail would not go out till Monday. Patient is aware and is ok with that.

## 2015-02-21 NOTE — Discharge Summary (Signed)
Patient ID: Sue Hines [id number] y.o. [birth year]  Admit date: 02/20/2015  Discharge date and time: 02/21/2015  Admitting Physician: Armandina Gemma  Discharge Physician: Adin Hector  Admission Diagnoses: PRIMARY HYPERPARATHYROIDISM  Discharge Diagnoses: Primary hyperparathyroidism  Operations: Procedure(s): PARATHYROIDECTOMY  Admission Condition: good  Discharged Condition: good  Indication for Admission: The patient is a 81 year old female who presents with a parathyroid neoplasm. Patient is referred by Dr. Bud Face for evaluation of suspected primary hyperparathyroidism. Patient was found on routine laboratory studies performed by her primary care physician to have mild hypercalcemia. Laboratory studies show calcium levels of 10.8. Patient had intact PTH levels ranging from 64-107. She subsequently underwent 24-hour urine collection with a range of results from 151 to greater than 1900. Patient has been treated for vitamin D deficiency. She is now referred for possible primary hyperparathyroidism. Ultrasound of the neck shows a 6 mm nodule posterior to the lower pole of the right lobe worrisome for parathyroid nodule. Sestamibi scan did not localize. Patient denies any previous neck surgery. She denies any family history of endocrinopathy and specifically no history of adrenal or pituitary tumors. Patient denies nephrolithiasis. She thinks she has osteopenia but not osteoporosis. She denies bone or joint pain. She denies fatigue. She was admitted and brought to the operating room electively.  Hospital Course: On the day of admission the patient was taken to the operating room and underwent neck expiration and parathyroidectomy. She was observed overnight and did very well. On postop day 1 she felt well and was ready to go home. Her voice was strong. Neck incision was clean and dry and soft without any evidence of hematoma. Chvostek's sign  negative. Calcium level was  9.0. This was 9.5 preop.    She was given instruction in diet and activities. She was given a prescription for analgesic medication. She was told to continue her usual medicines. She was told to make an appointment set jerking in 3 weeks. I told her that Dr. jerking would discuss her pathology reports with her.  Consults: None  Significant Diagnostic Studies: Surgical pathology  Treatments: surgery: Neck exploration and parathyroidectomy  Disposition: Home  Patient Instructions:    Medication List    TAKE these medications        amLODipine 5 MG tablet  Commonly known as:  NORVASC  Take 5 mg by mouth daily.     aspirin EC 81 MG tablet  Take 81 mg by mouth daily.     betaxolol 0.5 % ophthalmic suspension  Commonly known as:  BETOPTIC-S  Apply 1 drop to eye 2 (two) times daily.     bimatoprost 0.01 % Soln  Commonly known as:  LUMIGAN  Apply 1 drop to eye at bedtime.     estradiol 0.05 mg/24hr patch  Commonly known as:  CLIMARA - Dosed in mg/24 hr  Place 0.05 mg onto the skin once a week.     HYDROcodone-acetaminophen 5-325 MG per tablet  Commonly known as:  NORCO/VICODIN  Take 1-2 tablets by mouth every 4 (four) hours as needed for moderate pain.     montelukast 10 MG tablet  Commonly known as:  SINGULAIR  Take 10 mg by mouth as needed.     sertraline 25 MG tablet  Commonly known as:  ZOLOFT  Take 50 mg by mouth daily.        Activity: activity as tolerated Diet: low fat, low cholesterol diet Wound Care: none needed  Follow-up:  With Dr. Harlow Asa in  3 weeks.  Signed: Edsel Petrin. Dalbert Batman, M.D., FACS General and minimally invasive surgery Breast and Colorectal Surgery  02/21/2015, 7:40 AM

## 2015-02-23 ENCOUNTER — Encounter (HOSPITAL_COMMUNITY): Payer: Self-pay | Admitting: Surgery

## 2016-08-14 IMAGING — NM NM PARATHYROID W/ SPECT
4 series · 24 of 24 positions shown · non-contrast
Comparison: None.

CLINICAL DATA: Hypercalcemia.  Possible hyperparathyroidism.

EXAM:
NM PARATHYROID SCINTIGRAPHY AND SPECT IMAGING
TECHNIQUE: Following intravenous administration of radiopharmaceutical, early
and 2-hour delayed planar images were obtained in the anterior
projection. Delayed triplanar SPECT images were also obtained at 2
hours.
RADIOPHARMACEUTICALS:  25.0 mCiEc-YYm Sestamibi IV

[Series 1: spect - (id)_(id)_cor · 8.3mm · 8.28mm/px · 6 of 64 frames shown]
[frame 6/64]
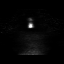
[frame 16/64]
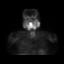
[frame 27/64]
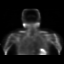
[frame 38/64]
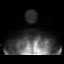
[frame 48/64]
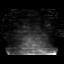
[frame 59/64]
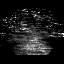

[Series 1: spect - (id)_(id)_tra · 8.3mm · 8.28mm/px · 6 of 64 frames shown]
[frame 6/64]
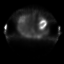
[frame 16/64]
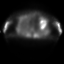
[frame 27/64]
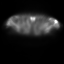
[frame 38/64]
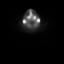
[frame 48/64]
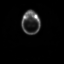
[frame 59/64]
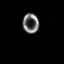

[Series 1: spect - (id)_(id)_hla · 8.3mm · 8.28mm/px · 6 of 64 frames shown]
[frame 6/64]
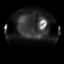
[frame 16/64]
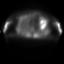
[frame 27/64]
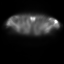
[frame 38/64]
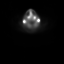
[frame 48/64]
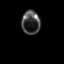
[frame 59/64]
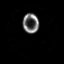

[Series 3: spect parathyroid · 8.28mm/px · 6 of 64 frames shown]
[frame 6/64]
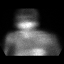
[frame 16/64]
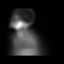
[frame 27/64]
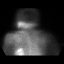
[frame 38/64]
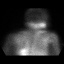
[frame 48/64]
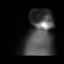
[frame 59/64]
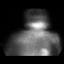

[24 of 24 positions shown; findings below may reference images not displayed]

FINDINGS: The initial plantar images of the neck demonstrate homogeneous
thyroid activity bilaterally. Delayed planar images demonstrate
fairly homogeneous partial washout of the thyroid activity. There is
no residual focal activity within the neck or superior mediastinum
on planar imaging.

Delayed SPECT imaging demonstrates equivocally increased activity
along the inferior aspect of the right thyroid lobe, best seen on
the coronal images. No other abnormal activity seen within the neck
or superior mediastinum.
IMPRESSION: No definitive parathyroid adenoma demonstrated within the neck or
superior mediastinum. There is equivocal asymmetric activity
inferiorly on the right on the delayed SPECT images which could
reflect a small inferior right parathyroid adenoma.

## 2016-08-22 IMAGING — US US SOFT TISSUE HEAD/NECK
1 series · 14 of 25 positions shown · non-contrast
Comparison: None.

CLINICAL DATA: Evaluate for parathyroid adenoma

EXAM:
THYROID ULTRASOUND
TECHNIQUE: Ultrasound examination of the thyroid gland and adjacent soft
tissues was performed.

[Series 1: us soft tissue head/neck · 0.07mm/px · 55 acquisitions, 14 frames shown]
[im 1/55]
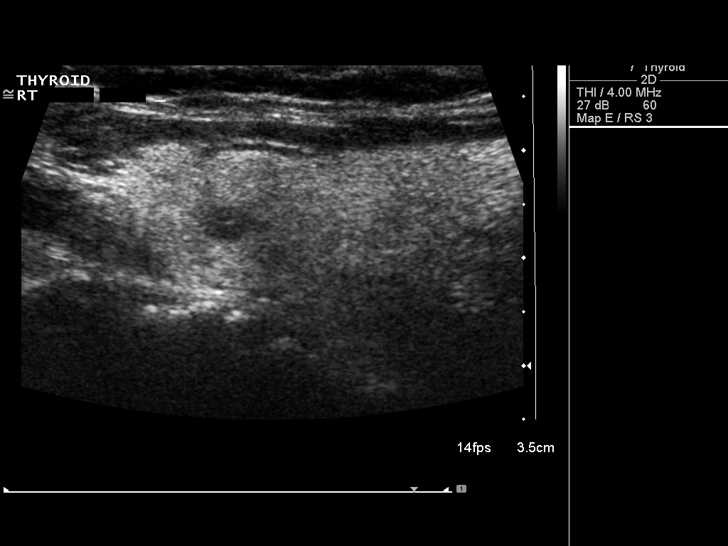
[im 5/55]
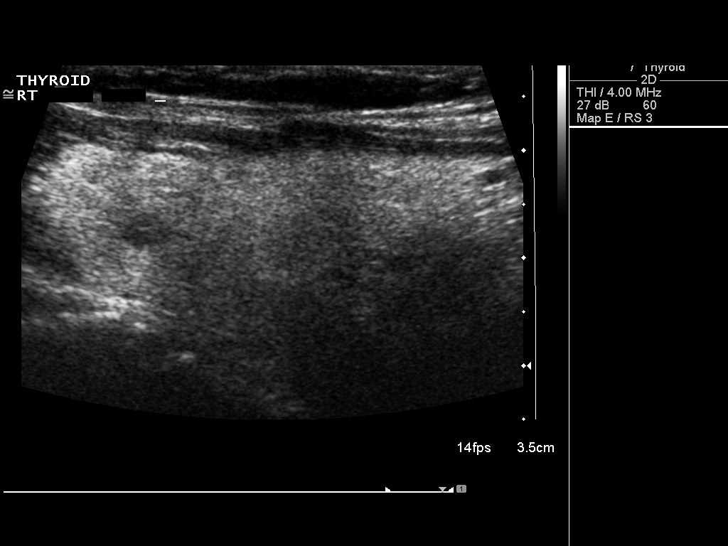
[im 10/55]
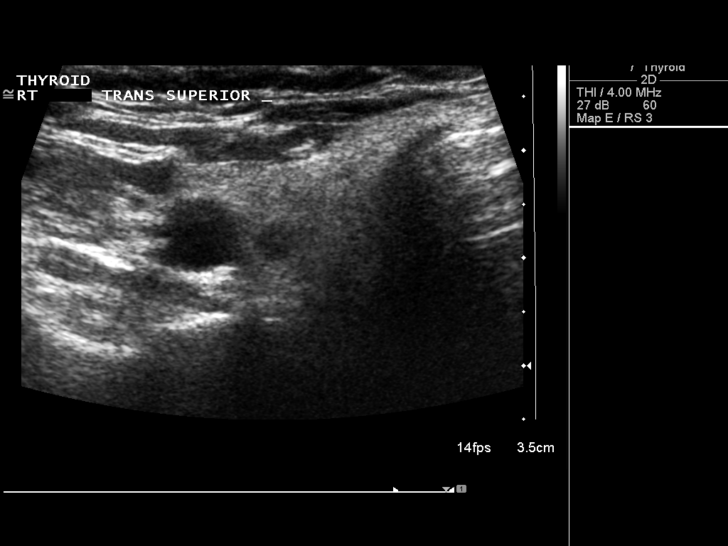
[im 14/55]
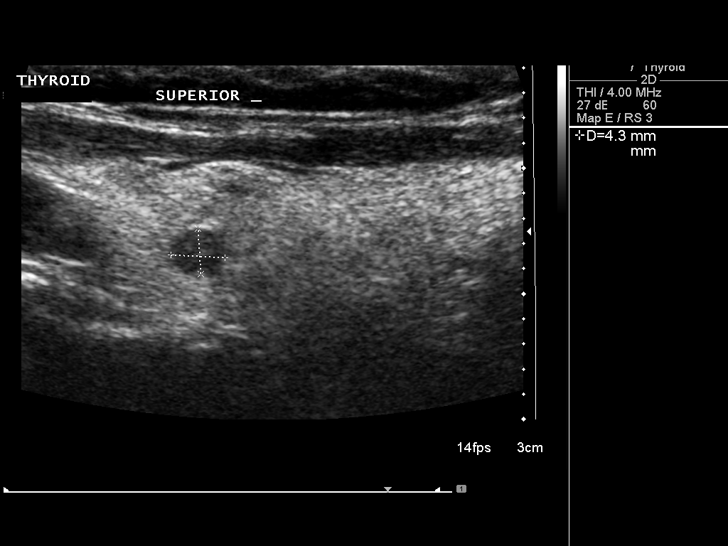
[im 19/55]
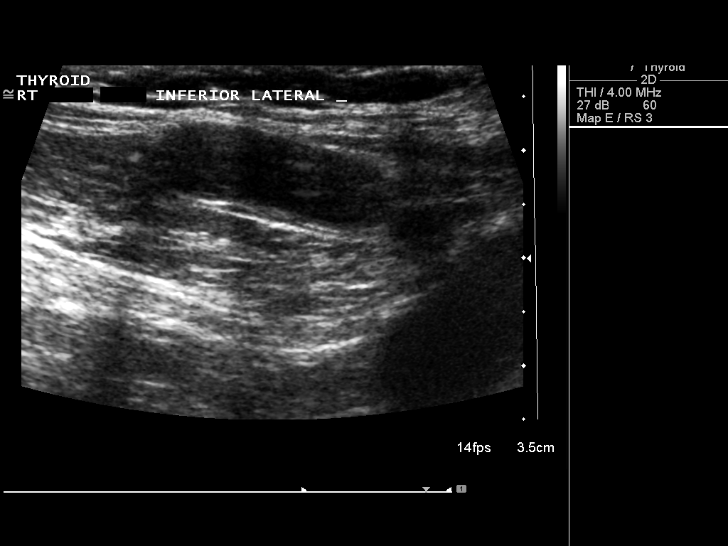
[im 21/55]
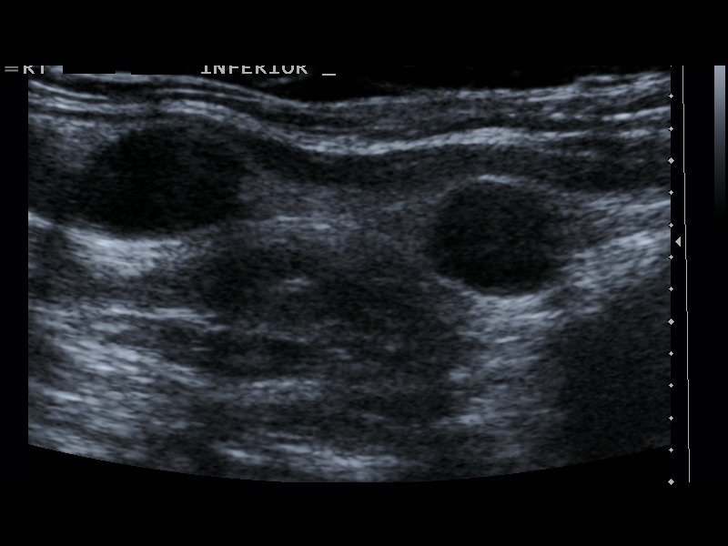
[im 25/55]
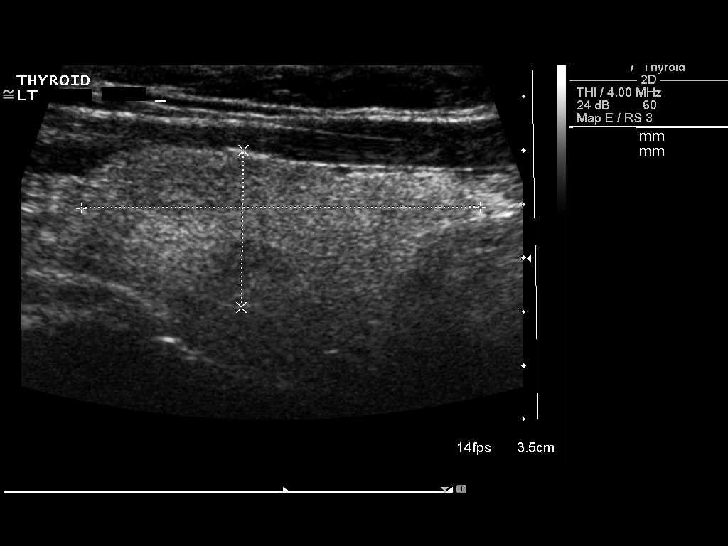
[im 30/55]
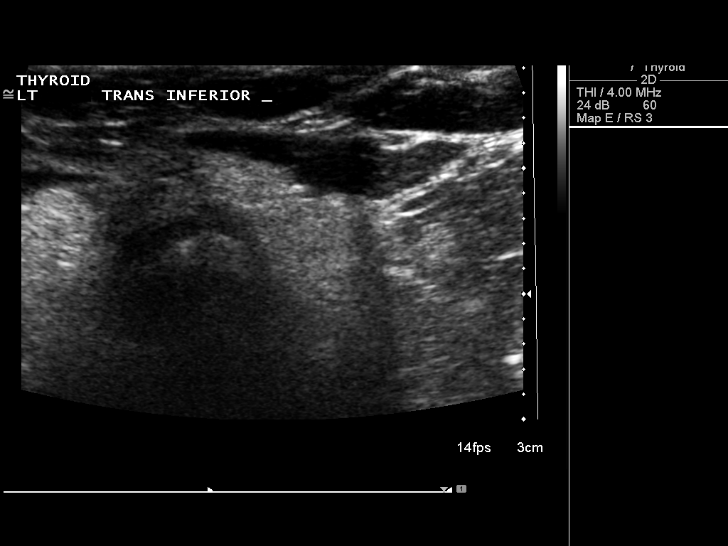
[im 34/55]
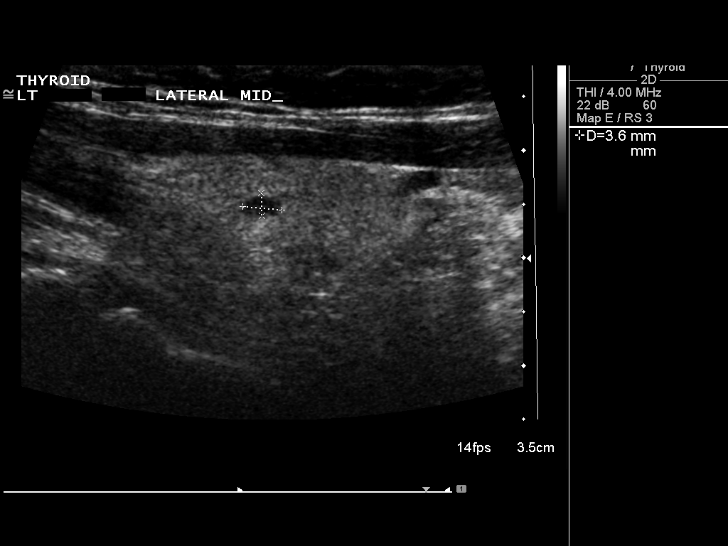
[im 37/55]
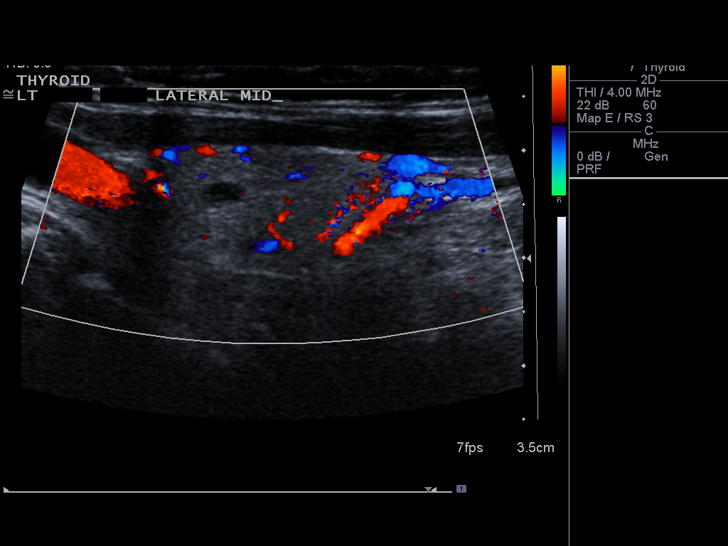
[im 41/55]
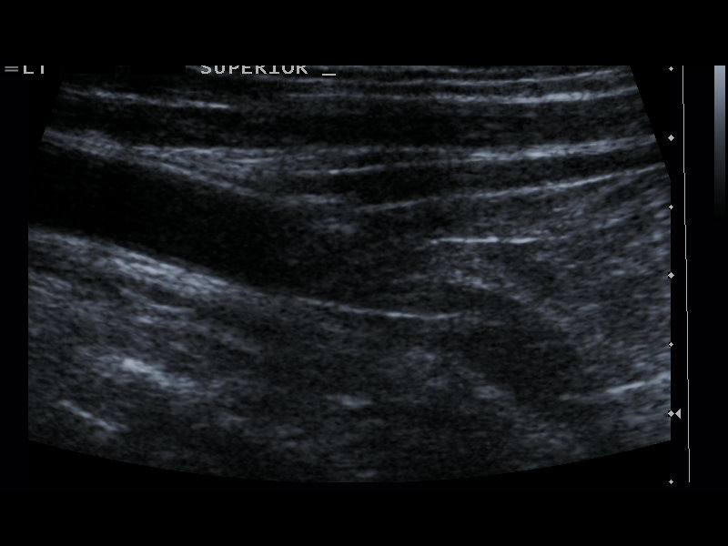
[im 46/55]
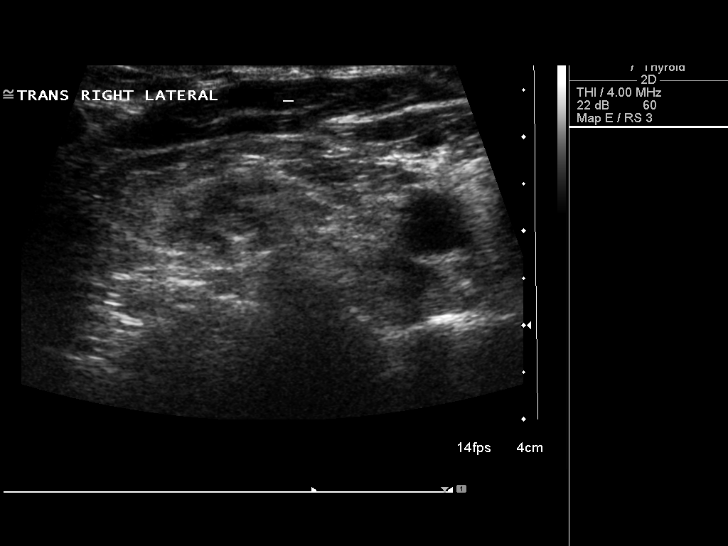
[im 50/55]
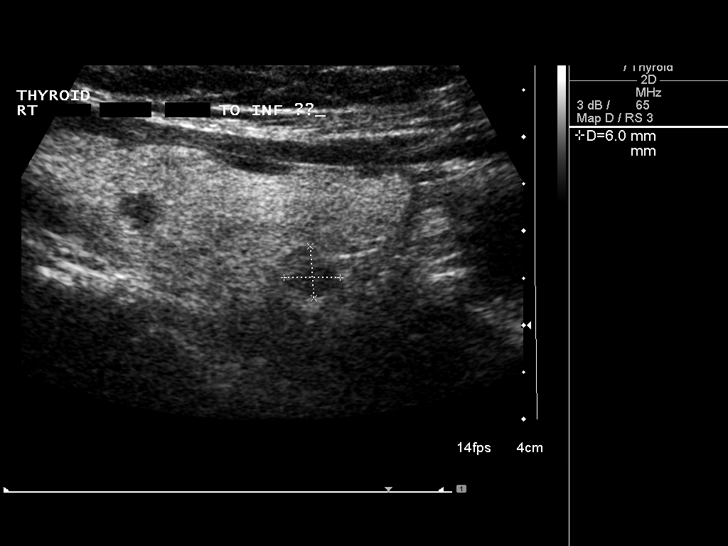
[im 55/55]
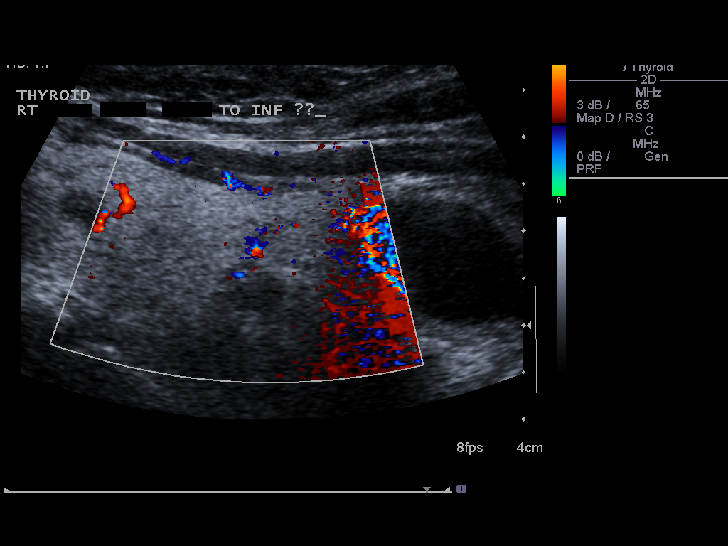

[14 of 25 positions shown; findings below may reference images not displayed]

FINDINGS: Right thyroid lobe

Measurements: 4.5 x 1.5 x 1.5 cm. 5 mm upper pole solid nodule. 6 mm
hypoechoic nodule is posterior to the lower pole of the right lobe.

Left thyroid lobe

Measurements: 3.7 x 1.5 x 1.2 cm. 4 mm mid left lobe nodule. The
upper pole is heterogeneous without focal nodule.

Isthmus

Thickness: 2 mm.  No nodules visualized.

Lymphadenopathy

None visualized.
IMPRESSION: There is a 6 mm hypoechoic nodule posterior to the lower pole of the
right lobe worrisome for a parathyroid nodule.

Otherwise there are very small bilateral thyroid nodules.

## 2021-07-19 DEATH — deceased
